# Patient Record
Sex: Female | Born: 1990 | Race: Black or African American | Hispanic: No | Marital: Single | Smoking: Former smoker
Health system: Southern US, Community
[De-identification: ages and names within clinical notes are randomized; demographics above are authoritative.]

## PROBLEM LIST (undated history)

## (undated) ENCOUNTER — Ambulatory Visit: Admission: EM | Payer: BLUE CROSS/BLUE SHIELD | Source: Home / Self Care

## (undated) HISTORY — PX: NO PAST SURGERIES: SHX2092

---
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
## 2011-06-06 ENCOUNTER — Encounter (HOSPITAL_COMMUNITY): Payer: Self-pay | Admitting: Family Medicine

## 2011-06-06 ENCOUNTER — Emergency Department (HOSPITAL_COMMUNITY): Payer: Self-pay

## 2011-06-06 ENCOUNTER — Emergency Department (HOSPITAL_COMMUNITY)
Admission: EM | Admit: 2011-06-06 | Discharge: 2011-06-06 | Disposition: A | Discharge status: Home / Self Care | Payer: Self-pay | Attending: Emergency Medicine | Admitting: Emergency Medicine

## 2011-06-06 DIAGNOSIS — M25512 Pain in left shoulder: Secondary | ICD-10-CM

## 2011-06-06 DIAGNOSIS — M25519 Pain in unspecified shoulder: Secondary | ICD-10-CM | POA: Insufficient documentation

## 2011-06-06 MED ORDER — IBUPROFEN 800 MG PO TABS: 800.0000 mg | ORAL_TABLET | Freq: Three times a day (TID) | ORAL | Status: AC

## 2011-06-06 MED ORDER — OXYCODONE-ACETAMINOPHEN 5-325 MG PO TABS
1.0000 | ORAL_TABLET | Freq: Once | ORAL | Status: AC
  Administered 2011-06-06: 1 via ORAL
  Filled 2011-06-06: qty 1

## 2011-06-06 MED ORDER — ACETAMINOPHEN-CODEINE #3 300-30 MG PO TABS: 1.0000 | ORAL_TABLET | Freq: Four times a day (QID) | ORAL | Status: AC | PRN

## 2011-06-06 MED ORDER — IBUPROFEN 800 MG PO TABS
800.0000 mg | ORAL_TABLET | Freq: Once | ORAL | Status: AC
  Administered 2011-06-06: 800 mg via ORAL
  Filled 2011-06-06: qty 1

## 2011-06-06 NOTE — Discharge Instructions (Signed)
Take ibuprofen as directed for inflammation and pain using tylenol #3 for breakthrough pain but do not drive or operate machinery with Tylenol #3 use. Establishment with a Primary Care provider is Very important for general health care concerns, minor illness and minor injury and for further evaluation and management of ongoing shoulder pain but return to ER for emergent changing or worsening of symptoms.   Shoulder Pain The shoulder is a ball and socket joint. The muscles and tendons (rotator cuff) are what keep the shoulder in its joint and stable. This collection of muscles and tendons holds in the head (ball) of the humerus (upper arm bone) in the fossa (cup) of the scapula (shoulder blade). Today no reason was found for your shoulder pain. Often pain in the shoulder may be treated conservatively with temporary immobilization. For example, holding the shoulder in one place using a sling for rest. Physical therapy may be needed if problems continue. HOME CARE INSTRUCTIONS   Apply ice to the sore area for 15 to 20 minutes, 3 to 4 times per day for the first 2 days. Put the ice in a plastic bag. Place a towel between the bag of ice and your skin.   If you have or were given a shoulder sling and straps, do not remove for as long as directed by your caregiver or until you see a caregiver for a follow-up examination. If you need to remove it to shower or bathe, move your arm as little as possible.   Sleep on several pillows at night to lessen swelling and pain.   Only take over-the-counter or prescription medicines for pain, discomfort, or fever as directed by your caregiver.   Keep any follow-up appointments in order to avoid any type of permanent shoulder disability or chronic pain problems.  SEEK MEDICAL CARE IF:   Pain in your shoulder increases or new pain develops in your arm, hand, or fingers.   Your hand or fingers are colder than your other hand.   You do not obtain pain relief with the  medications or your pain becomes worse.  SEEK IMMEDIATE MEDICAL CARE IF:   Your arm, hand, or fingers are numb or tingling.   Your arm, hand, or fingers are swollen, painful, or turn white or blue.   You develop chest pain or shortness of breath.  MAKE SURE YOU:   Understand these instructions.   Will watch your condition.   Will get help right away if you are not doing well or get worse.  Document Released: 10/28/2004 Document Revised: 01/07/2011 Document Reviewed: 01/02/2011 [facility] Patient Information 2012 [place], [place].

## 2011-06-06 NOTE — ED Provider Notes (Signed)
History     CSN: [id number]  Arrival date & time 06/06/11  4098   First MD Initiated Contact with Patient 06/06/11 0830      Chief Complaint  Patient presents with  . Shoulder Pain    (Consider location/radiation/quality/duration/timing/severity/associated sxs/prior treatment) HPI  Patient presents to ER complaining of acute onset left shoulder pain that woke her from her sleep. Patient denies known injury to shoulder. Patient states pain is constant and unchanged by movement. She denies redness, swelling, or LUE numbness/tingling/weakness. She denies CP, SOB, abdominal pain, n/v. She took nothing for pain PTA. Patient states pain radiated down from shoulder into arm.   History reviewed. No pertinent past medical history.  History reviewed. No pertinent past surgical history.  History reviewed. No pertinent family history.  History  Substance Use Topics  . Smoking status: Current Some Day Smoker  . Smokeless tobacco: Not on file  . Alcohol Use: No    OB History    Grav Para Term Preterm Abortions TAB SAB Ect Mult Living                  Review of Systems  All other systems reviewed and are negative.    Allergies  Review of patient's allergies indicates no known allergies.  Home Medications  No current outpatient prescriptions on file.  BP 125/67  Pulse 73  Temp 97.5 F (36.4 C)  Resp 18  SpO2 100%  LMP 05/27/2011  Physical Exam  Nursing note and vitals reviewed. Constitutional: She is oriented to person, place, and time. She appears well-developed and well-nourished. No distress.  HENT:  Head: Normocephalic and atraumatic.  Eyes: Conjunctivae are normal.  Neck: Normal range of motion. Neck supple.  Cardiovascular: Normal rate, regular rhythm, normal heart sounds and intact distal pulses.  Exam reveals no gallop and no friction rub.   No murmur heard. Pulmonary/Chest: Effort normal and breath sounds normal. No respiratory distress. She has no wheezes.  She has no rales. She exhibits no tenderness.  Abdominal: Soft. Bowel sounds are normal. She exhibits no distension and no mass. There is no tenderness. There is no rebound and no guarding.  Musculoskeletal: Normal range of motion. She exhibits no edema and no tenderness.       No TTP of entire left shoulder though complaints of constant pain despite touch or movement. 5/5 strength of LUE with FROM and no crepitous. No swelling, erythema or heat of shoulder. Good radial pulse and sensation of entire LUE.   Neurological: She is alert and oriented to person, place, and time. She has normal reflexes.  Skin: Skin is warm and dry. No rash noted. She is not diaphoretic. No erythema.  Psychiatric: She has a normal mood and affect.    ED Course  Procedures (including critical care time)  PO ibuprofen and percocet.   Labs Reviewed - No data to display Dg Shoulder Left  06/06/2011  *RADIOLOGY REPORT*  Clinical Data: Left shoulder pain.  Limited range of motion.  No known injury.  LEFT SHOULDER - 2+ VIEW 06/06/2011:  Comparison: None.  Findings: No evidence of acute fracture or dislocation. Subacromial space well preserved.  Acromioclavicular joint intact without significant degenerative change.  No intrinsic osseous abnormalities.  IMPRESSION: Normal examination.  Original Report Authenticated By: Arnell Sieving, M.D.     1. Left shoulder pain       MDM  FROM of left shoulder with 5/5 strength, normal reflexes and no signs or symptoms of septic joint  with normal shoulder xray. LUE is neurovas intact Patient is pushing herself up and down in bed with left shoulder without difficulty stating improved pain after PO pain medication. Denies CP, SOB or abdominal pain limiting pain to shoulder. Spoke at length about conservative management of pain and reasons to return to ER vs f/u with PCP for recheck of ongoing pain.         Lenon Oms [place], [place] 06/06/11 [phone]

## 2011-06-06 NOTE — ED Notes (Signed)
sts she woke up to pain in shoulder this am. Limited mobility in left shoulder. Denies injury.

## 2011-06-07 NOTE — ED Provider Notes (Signed)
Medical screening examination/treatment/procedure(s) were performed by non-physician practitioner and as supervising physician I was immediately available for consultation/collaboration.  Theodoro Koval, MD 06/07/11 0403 

## 2012-05-25 ENCOUNTER — Encounter (HOSPITAL_COMMUNITY): Payer: Self-pay | Admitting: Emergency Medicine

## 2012-05-25 ENCOUNTER — Emergency Department (HOSPITAL_COMMUNITY)
Admission: EM | Admit: 2012-05-25 | Discharge: 2012-05-25 | Disposition: A | Discharge status: Home / Self Care | Payer: No Typology Code available for payment source | Attending: Emergency Medicine | Admitting: Emergency Medicine

## 2012-05-25 ENCOUNTER — Emergency Department (HOSPITAL_COMMUNITY): Payer: No Typology Code available for payment source

## 2012-05-25 DIAGNOSIS — F17200 Nicotine dependence, unspecified, uncomplicated: Secondary | ICD-10-CM | POA: Insufficient documentation

## 2012-05-25 DIAGNOSIS — Y9389 Activity, other specified: Secondary | ICD-10-CM | POA: Insufficient documentation

## 2012-05-25 DIAGNOSIS — S0993XA Unspecified injury of face, initial encounter: Secondary | ICD-10-CM | POA: Insufficient documentation

## 2012-05-25 DIAGNOSIS — M542 Cervicalgia: Secondary | ICD-10-CM

## 2012-05-25 DIAGNOSIS — Y92410 Unspecified street and highway as the place of occurrence of the external cause: Secondary | ICD-10-CM | POA: Insufficient documentation
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
## 2012-05-25 NOTE — ED Notes (Signed)
Patient transported to X-ray 

## 2012-06-27 ENCOUNTER — Encounter (HOSPITAL_COMMUNITY): Payer: Self-pay | Admitting: Emergency Medicine

## 2012-06-27 ENCOUNTER — Emergency Department (HOSPITAL_COMMUNITY)
Admission: EM | Admit: 2012-06-27 | Discharge: 2012-06-28 | Disposition: A | Discharge status: Home / Self Care | Payer: Self-pay | Attending: Emergency Medicine | Admitting: Emergency Medicine

## 2012-06-27 DIAGNOSIS — N920 Excessive and frequent menstruation with regular cycle: Secondary | ICD-10-CM | POA: Insufficient documentation

## 2012-06-27 DIAGNOSIS — Z3202 Encounter for pregnancy test, result negative: Secondary | ICD-10-CM | POA: Insufficient documentation

## 2012-06-27 DIAGNOSIS — R509 Fever, unspecified: Secondary | ICD-10-CM | POA: Insufficient documentation

## 2012-06-27 DIAGNOSIS — F17200 Nicotine dependence, unspecified, uncomplicated: Secondary | ICD-10-CM | POA: Insufficient documentation

## 2012-06-27 DIAGNOSIS — R109 Unspecified abdominal pain: Secondary | ICD-10-CM | POA: Insufficient documentation

## 2012-06-27 DIAGNOSIS — R350 Frequency of micturition: Secondary | ICD-10-CM | POA: Insufficient documentation

## 2012-06-27 DIAGNOSIS — R300 Dysuria: Secondary | ICD-10-CM | POA: Insufficient documentation

## 2012-06-27 MED ORDER — FENTANYL CITRATE 0.05 MG/ML IJ SOLN
50.0000 ug | Freq: Once | INTRAMUSCULAR | Status: AC
  Administered 2012-06-27: 50 ug via INTRAVENOUS
  Filled 2012-06-27: qty 2

## 2012-06-27 MED ORDER — ONDANSETRON HCL 4 MG/2ML IJ SOLN
4.0000 mg | Freq: Once | INTRAMUSCULAR | Status: AC
  Administered 2012-06-27: 4 mg via INTRAVENOUS
  Filled 2012-06-27: qty 2

## 2012-06-27 MED ORDER — SODIUM CHLORIDE 0.9 % IV SOLN
Freq: Once | INTRAVENOUS | Status: AC
  Administered 2012-06-27: 23:00:00 via INTRAVENOUS

## 2012-06-27 MED ORDER — DEXTROSE 5 % IV SOLN
1.0000 g | Freq: Once | INTRAVENOUS | Status: AC
  Administered 2012-06-28: 1 g via INTRAVENOUS
  Filled 2012-06-27: qty 10

## 2012-06-27 MED ORDER — KETOROLAC TROMETHAMINE 30 MG/ML IJ SOLN
30.0000 mg | Freq: Once | INTRAMUSCULAR | Status: AC
  Administered 2012-06-27: 30 mg via INTRAVENOUS
  Filled 2012-06-27: qty 1

## 2012-06-27 MED ORDER — ACETAMINOPHEN 325 MG PO TABS
650.0000 mg | ORAL_TABLET | Freq: Once | ORAL | Status: AC
  Administered 2012-06-27: 650 mg via ORAL
  Filled 2012-06-27: qty 2
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
## 2012-06-28 LAB — COMPREHENSIVE METABOLIC PANEL
CO2: 24 mEq/L (ref 19–32)
Calcium: 9.4 mg/dL (ref 8.4–10.5)
Creatinine, Ser: 0.83 mg/dL (ref 0.50–1.10)
GFR calc Af Amer: >90 mL/min (ref >90)
GFR calc non Af Amer: >90 mL/min (ref >90)
Glucose, Bld: 86 mg/dL (ref 70–99)

## 2012-06-28 LAB — URINALYSIS, ROUTINE W REFLEX MICROSCOPIC
Protein, ur: NEGATIVE mg/dL
Urobilinogen, UA: 1 mg/dL (ref 0.0–1.0)

## 2012-06-28 LAB — PREGNANCY, URINE: Preg Test, Ur: NEGATIVE

## 2012-06-28 LAB — CBC WITH DIFFERENTIAL/PLATELET
Eosinophils Relative: 2 % (ref 0–5)
HCT: 36.7 % (ref 36.0–46.0)
Lymphocytes Relative: 21 % (ref 12–46)
Lymphs Abs: 1.6 10*3/uL (ref 0.7–4.0)
MCV: 84.2 fL (ref 78.0–100.0)
Monocytes Absolute: 0.4 10*3/uL (ref 0.1–1.0)
RDW: 13.6 % (ref 11.5–15.5)
WBC: 7.8 10*3/uL (ref 4.0–10.5)

## 2012-06-28 MED ORDER — SODIUM CHLORIDE 0.9 % IV SOLN
Freq: Once | INTRAVENOUS | Status: AC
  Administered 2012-06-28: via INTRAVENOUS

## 2012-06-28 MED ORDER — NAPROXEN 500 MG PO TABS: 500.0000 mg | ORAL_TABLET | Freq: Two times a day (BID) | ORAL | Status: DC

## 2012-06-28 NOTE — ED Provider Notes (Signed)
Medical screening examination/treatment/procedure(s) were performed by non-physician practitioner and as supervising physician I was immediately available for consultation/collaboration.  Lissett Favorite, MD 06/28/12 1812 

## 2012-06-28 NOTE — ED Provider Notes (Signed)
History     CSN: [id number]  Arrival date & time 06/27/12  2235   First MD Initiated Contact with Patient 06/27/12 2243      Chief Complaint  Patient presents with  . Flank Pain    (Consider location/radiation/quality/duration/timing/severity/associated sxs/prior treatment) HPI Briana Warren is a 22 y.o. female with no significant past medical history presents emergency department complaining of right-sided flank pain that has been worsening for the last 4 days.  Pain is described as cramping, rated at 9/10 in severity.  Symptoms are moderate.  No known exacerbating or alleviating factors.  Associated symptoms include fever, dysuria, urinary frequency, and menorrhagia.  Patient denies any dyspareunia, abnormal vaginal discharge, malodor, vomiting, diarrhea, melena, anorexia.  History reviewed. No pertinent past medical history.  History reviewed. No pertinent past surgical history.  History reviewed. No pertinent family history.  History  Substance Use Topics  . Smoking status: Current Every Day Smoker  . Smokeless tobacco: Not on file  . Alcohol Use: No    OB History   Grav Para Term Preterm Abortions TAB SAB Ect Mult Living                  Review of Systems Ten systems reviewed and are negative for acute change, except as noted in the HPI.   Allergies  Review of patient's allergies indicates no known allergies.  Home Medications  No current outpatient prescriptions on file.  BP 98/54  Pulse 78  Temp(Src) 98.8 F (37.1 C) (Oral)  Resp 18  SpO2 99%  Physical Exam  Nursing note and vitals reviewed. Constitutional: She is oriented to person, place, and time. She appears well-developed and well-nourished.  HENT:  Head: Normocephalic and atraumatic.  Eyes: Conjunctivae and EOM are normal.  Neck: Normal range of motion.  Cardiovascular:  Regular rate rhythm  Pulmonary/Chest: Effort normal.  Lungs clear to auscultation bilaterally.  No chest wall tenderness   Abdominal:  Abdomen soft nontender  Genitourinary:  No CVA tenderness  Musculoskeletal: Normal range of motion.  Neurological: She is alert and oriented to person, place, and time.  Skin: Skin is warm and dry. No rash noted. She is not diaphoretic.  Psychiatric: She has a normal mood and affect. Her behavior is normal.    ED Course  Procedures (including critical care time)  Labs Reviewed  URINALYSIS, ROUTINE W REFLEX MICROSCOPIC - Abnormal; Notable for the following:    Hgb urine dipstick MODERATE (*)    Leukocytes, UA SMALL (*)    All other components within normal limits  CBC WITH DIFFERENTIAL  COMPREHENSIVE METABOLIC PANEL  LIPASE, BLOOD  PREGNANCY, URINE  URINE MICROSCOPIC-ADD ON   No results found.   No diagnosis found.  BP 98/54  Pulse 78  Temp(Src) 98.8 F (37.1 C) (Oral)  Resp 18  SpO2 99%   MDM  Patient emergency department febrile complaining of right flank pain and urinary symptoms.  IV Rocephin started for concern for pyelonephritis, however urinalysis is not consistent with urinary tract infection.  No CVA or suprapubic abdominal tenderness on exam.  No peritoneal signs. Patient also given IV fluids and Toradol.  Significant improvement and pain free after treatment in the emergency department.  Patient to be discharged with primary care followup and strict return precautions.        Jaci Carrel, [place] 06/28/12 0126

## 2015-02-25 IMAGING — CR DG CERVICAL SPINE COMPLETE 4+V
8 series · 8 of 8 positions shown · non-contrast
Comparison: None.

CLINICAL DATA: Pain post trauma

CERVICAL SPINE - COMPLETE 4+ VIEW

[w cervical spine lat]
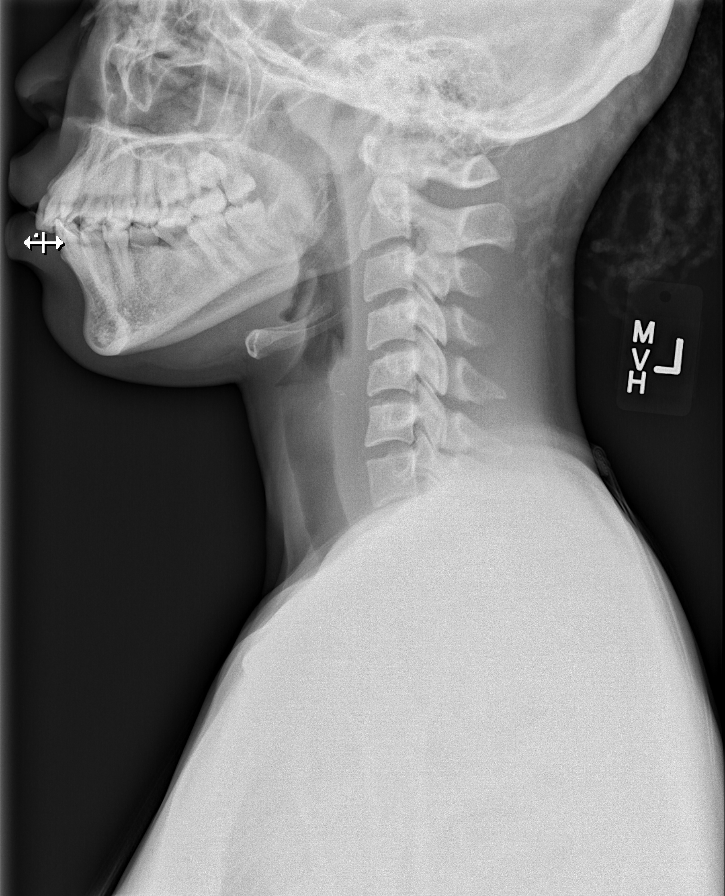

[w cervical spine ap_obl (1 of 2)]
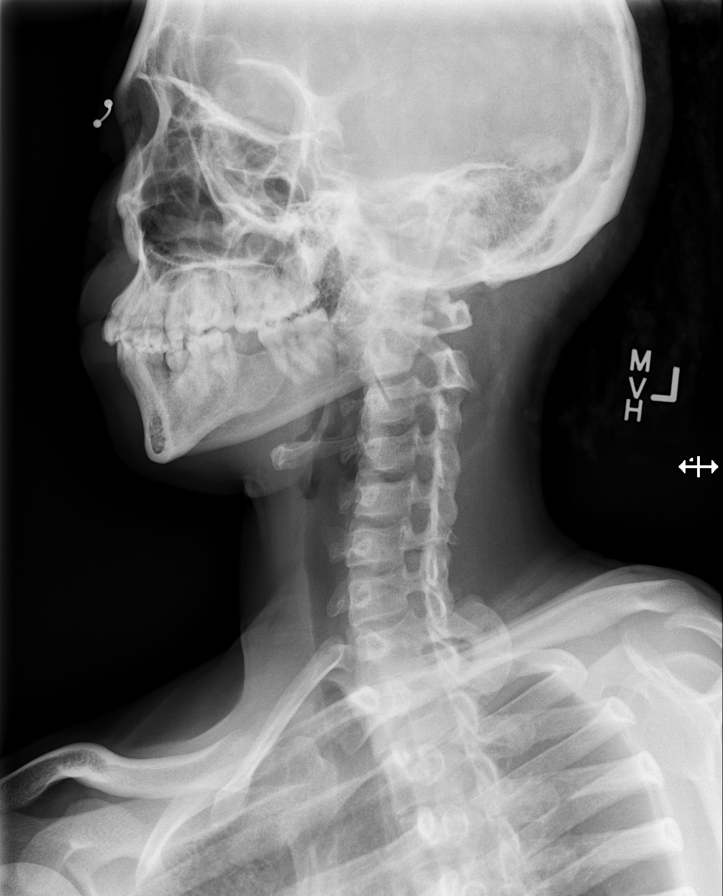

[w cervical spine ap_obl (2 of 2)]
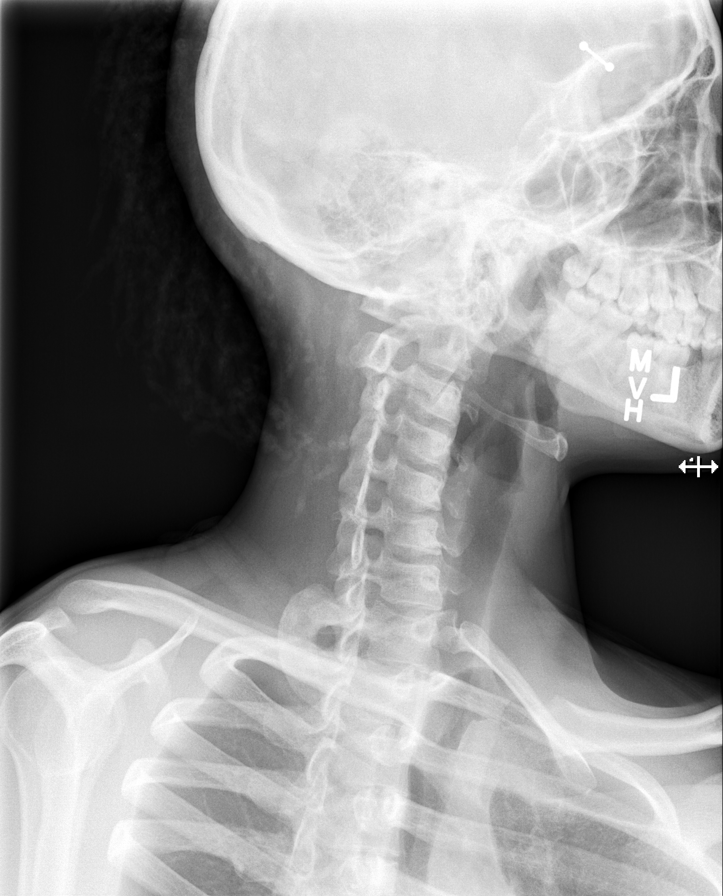

[w cervical spine ap]
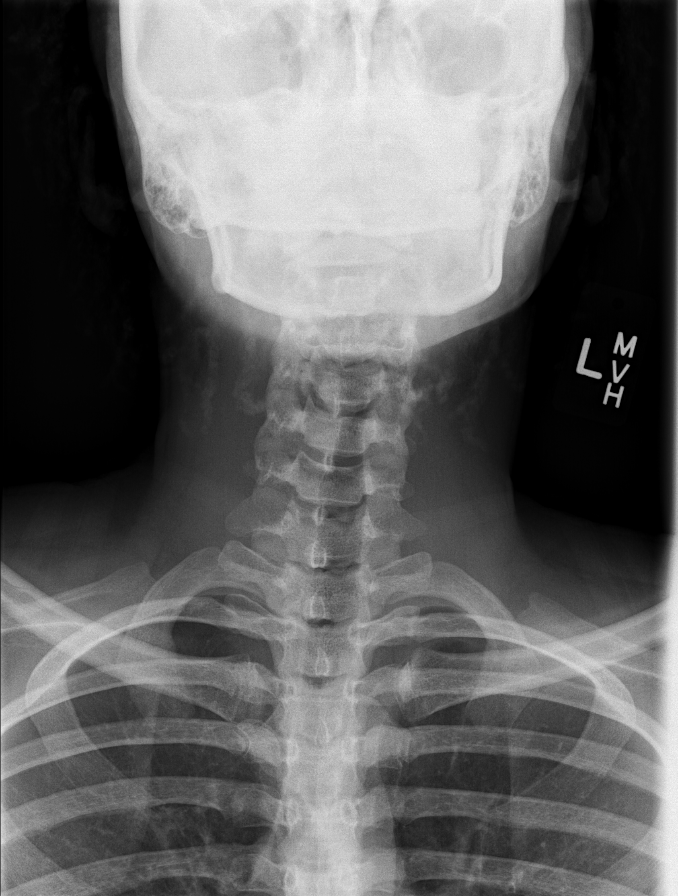

[w cervical spine odontoid (1 of 3)]
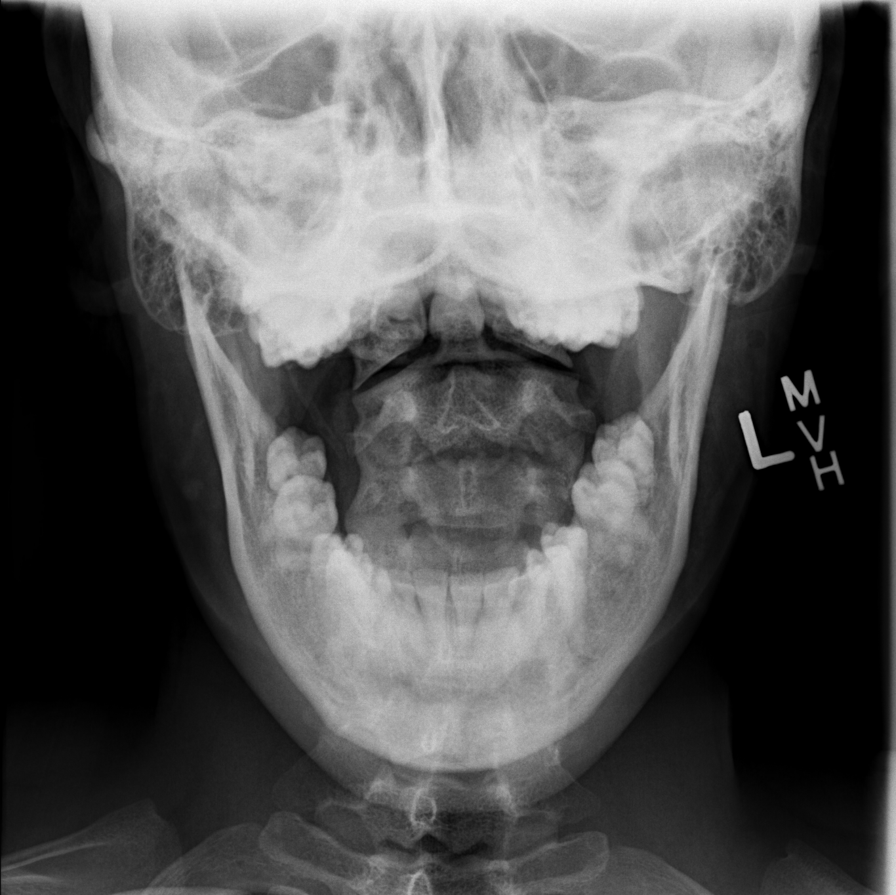

[w cervical spine odontoid (2 of 3)]
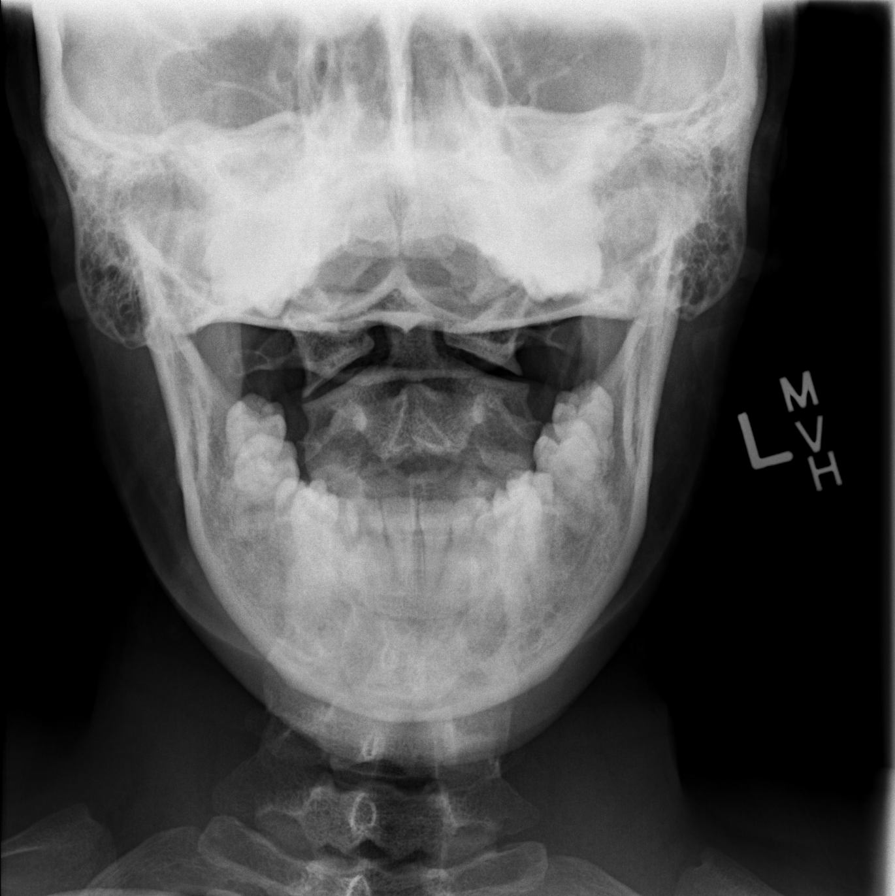

[w cervical spine odontoid (3 of 3)]
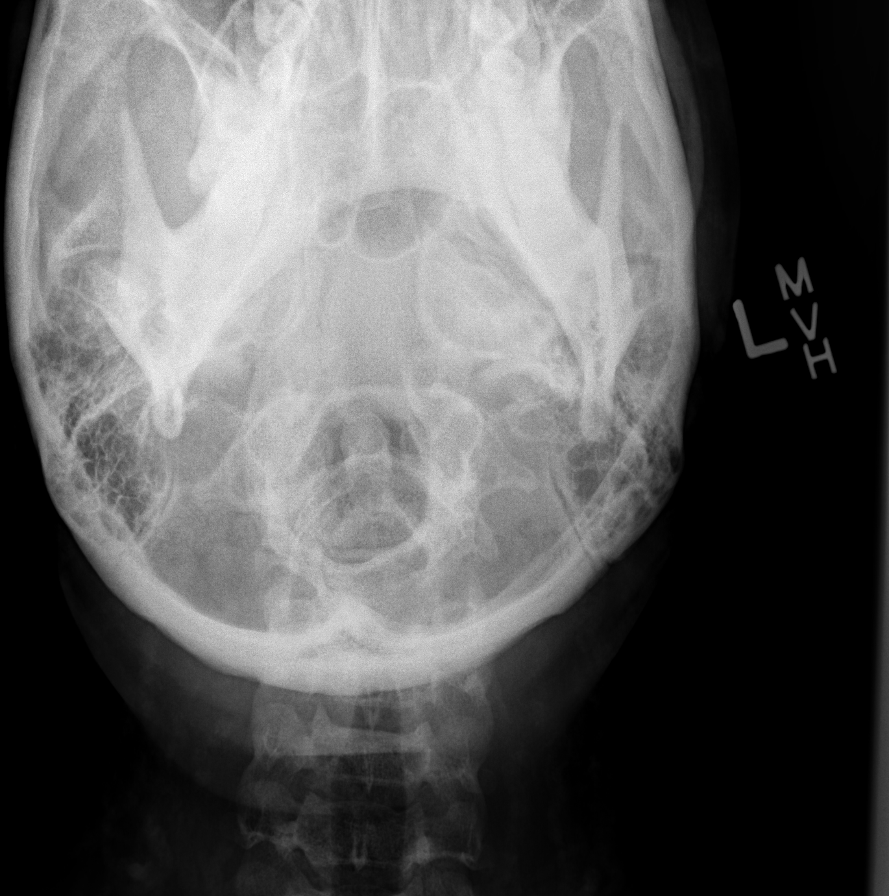

[w cervical swimmers]
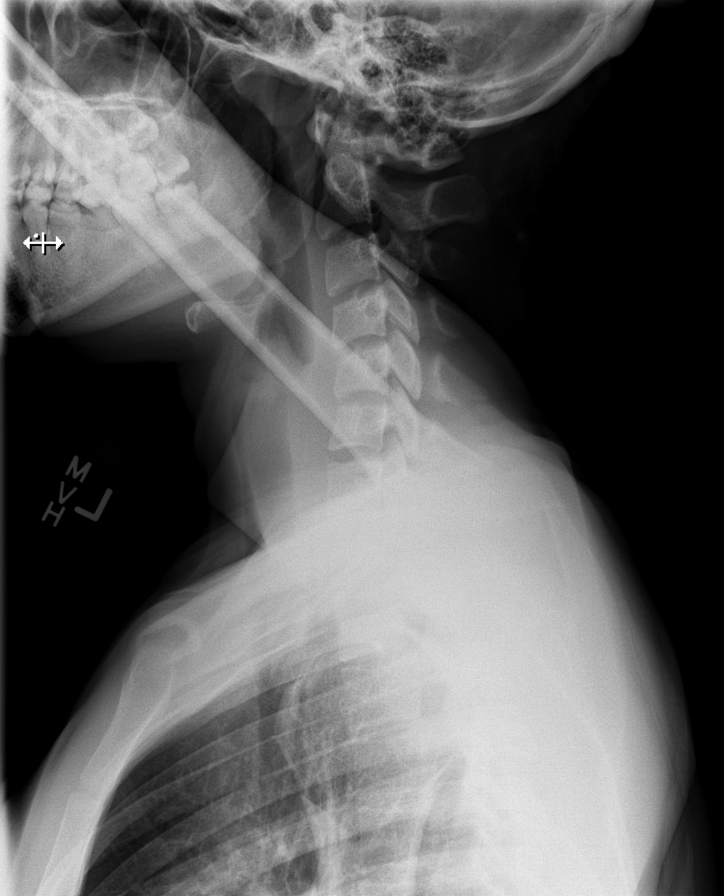

[8 of 8 positions shown; findings below may reference images not displayed]

FINDINGS: Frontal, lateral, bilateral oblique, and open mouth
odontoid images were obtained.  There is no fracture or
spondylolisthesis.  Prevertebral soft tissues and predental space
regions are normal.  Disc spaces appear intact.  There is no
appreciable exit foraminal narrowing on the oblique views.

There is reversal of the lordotic curvature.
IMPRESSION: Reversal lordotic curvature.  Suspect muscle spasm.  If
there is concern for ligamentous injury, lateral flexion and
extension views could be helpful to further assess.

There is no fracture or spondylolisthesis.

## 2019-08-02 ENCOUNTER — Emergency Department
Admission: EM | Admit: 2019-08-02 | Discharge: 2019-08-02 | Disposition: A | Discharge status: Home / Self Care | Payer: Self-pay | Attending: Emergency Medicine | Admitting: Emergency Medicine

## 2019-08-02 ENCOUNTER — Other Ambulatory Visit: Payer: Self-pay

## 2019-08-02 ENCOUNTER — Encounter: Payer: Self-pay | Admitting: Emergency Medicine

## 2019-08-02 DIAGNOSIS — Y92410 Unspecified street and highway as the place of occurrence of the external cause: Secondary | ICD-10-CM | POA: Insufficient documentation

## 2019-08-02 DIAGNOSIS — S40012A Contusion of left shoulder, initial encounter: Secondary | ICD-10-CM | POA: Insufficient documentation

## 2019-08-02 DIAGNOSIS — Y999 Unspecified external cause status: Secondary | ICD-10-CM | POA: Insufficient documentation

## 2019-08-02 DIAGNOSIS — S161XXA Strain of muscle, fascia and tendon at neck level, initial encounter: Secondary | ICD-10-CM | POA: Insufficient documentation

## 2019-08-02 DIAGNOSIS — Y939 Activity, unspecified: Secondary | ICD-10-CM | POA: Insufficient documentation

## 2019-08-02 DIAGNOSIS — F17210 Nicotine dependence, cigarettes, uncomplicated: Secondary | ICD-10-CM | POA: Insufficient documentation

## 2019-08-02 MED ORDER — IBUPROFEN 600 MG PO TABS: 600.0000 mg | ORAL_TABLET | Freq: Four times a day (QID) | ORAL | 0 refills | Status: DC | PRN

## 2019-08-02 MED ORDER — CYCLOBENZAPRINE HCL 10 MG PO TABS
10.0000 mg | ORAL_TABLET | Freq: Once | ORAL | Status: AC
  Administered 2019-08-02: 10 mg via ORAL
  Filled 2019-08-02: qty 1

## 2019-08-02 MED ORDER — IBUPROFEN 800 MG PO TABS
800.0000 mg | ORAL_TABLET | Freq: Once | ORAL | Status: AC
  Administered 2019-08-02: 800 mg via ORAL
  Filled 2019-08-02: qty 1

## 2019-08-02 MED ORDER — CYCLOBENZAPRINE HCL 5 MG PO TABS: 5.0000 mg | ORAL_TABLET | Freq: Three times a day (TID) | ORAL | 0 refills | Status: DC | PRN

## 2019-08-02 NOTE — ED Notes (Signed)
Pt states having pain to her left shoulder that radiates to her neck. Patient denies loc.
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
## 2019-08-02 NOTE — Discharge Instructions (Signed)
Please apply ice to the left shoulder and cervical spine 20 minutes every hour for the next 2 days.  After 2 days transition to heat.  You may take Tylenol and ibuprofen as needed from pain.  Take Flexeril as needed for muscle tightness and spasms.  Try to move and stay active and perform gentle stretching exercises.  Return to the ER for any worsening symptoms or urgent changes in health.

## 2019-08-13 ENCOUNTER — Other Ambulatory Visit: Payer: Self-pay

## 2019-08-13 ENCOUNTER — Ambulatory Visit
Admission: EM | Admit: 2019-08-13 | Discharge: 2019-08-13 | Disposition: A | Discharge status: Home / Self Care | Payer: Self-pay | Attending: Family Medicine | Admitting: Family Medicine

## 2019-08-13 ENCOUNTER — Ambulatory Visit (INDEPENDENT_AMBULATORY_CARE_PROVIDER_SITE_OTHER): Payer: Self-pay

## 2019-08-13 ENCOUNTER — Encounter: Payer: Self-pay | Admitting: Emergency Medicine

## 2019-08-13 DIAGNOSIS — M25512 Pain in left shoulder: Secondary | ICD-10-CM

## 2019-08-13 DIAGNOSIS — S29019A Strain of muscle and tendon of unspecified wall of thorax, initial encounter: Secondary | ICD-10-CM

## 2019-08-13 MED ORDER — TIZANIDINE HCL 4 MG PO TABS: 4.0000 mg | ORAL_TABLET | Freq: Three times a day (TID) | ORAL | 0 refills | Status: DC | PRN

## 2019-08-13 MED ORDER — MELOXICAM 15 MG PO TABS: 15.0000 mg | ORAL_TABLET | Freq: Every day | ORAL | 0 refills | Status: DC
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
## 2019-08-13 NOTE — Discharge Instructions (Signed)
Heat, ice, over the counter tylenol
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
## 2020-02-07 ENCOUNTER — Other Ambulatory Visit: Payer: Self-pay

## 2020-02-07 DIAGNOSIS — Z20822 Contact with and (suspected) exposure to covid-19: Secondary | ICD-10-CM

## 2020-02-12 LAB — NOVEL CORONAVIRUS, NAA: SARS-CoV-2, NAA: DETECTED — AB

## 2022-10-14 ENCOUNTER — Ambulatory Visit
Admission: EM | Admit: 2022-10-14 | Discharge: 2022-10-14 | Disposition: A | Discharge status: Home / Self Care | Payer: BLUE CROSS/BLUE SHIELD | Attending: Family Medicine | Admitting: Family Medicine

## 2022-10-14 ENCOUNTER — Ambulatory Visit (INDEPENDENT_AMBULATORY_CARE_PROVIDER_SITE_OTHER): Payer: BLUE CROSS/BLUE SHIELD

## 2022-10-14 DIAGNOSIS — M7581 Other shoulder lesions, right shoulder: Secondary | ICD-10-CM

## 2022-10-14 DIAGNOSIS — M25511 Pain in right shoulder: Secondary | ICD-10-CM

## 2022-10-14 MED ORDER — IBUPROFEN 800 MG PO TABS
800.0000 mg | ORAL_TABLET | Freq: Once | ORAL | Status: AC
  Administered 2022-10-14: 800 mg via ORAL

## 2022-10-14 MED ORDER — NAPROXEN 500 MG PO TABS: 500.0000 mg | ORAL_TABLET | Freq: Two times a day (BID) | ORAL | 0 refills | Status: AC

## 2022-10-14 MED ORDER — NAPROXEN 500 MG PO TABS: 500.0000 mg | ORAL_TABLET | Freq: Two times a day (BID) | ORAL | 0 refills | Status: DC

## 2022-10-14 MED ORDER — CYCLOBENZAPRINE HCL 5 MG PO TABS: 5.0000 mg | ORAL_TABLET | Freq: Three times a day (TID) | ORAL | 0 refills | Status: DC | PRN

## 2022-10-14 MED ORDER — CYCLOBENZAPRINE HCL 5 MG PO TABS: 5.0000 mg | ORAL_TABLET | Freq: Three times a day (TID) | ORAL | 0 refills | Status: AC | PRN

## 2022-10-14 NOTE — ED Provider Notes (Signed)
[facility]    CSN: [id number] Arrival date & time: 10/14/22  1023      History   Chief Complaint Chief Complaint  Patient presents with   Shoulder Pain    HPI  HPI Anavel Hollier is a 32 y.o. female.   Tanishia presents for right shoulder pain. Five days ago she ewas working out. Denies heavy lifting, only lifted 15 lb and hasn't been to the gym in months.  A couple nights ago, she noticed it hurt when she slept on that side. She woke up this morning and it was extremely painful.  No treamtnets prior to arrival.  No ice or heat. No preivous injury. No trauma or falls.      History reviewed. No pertinent past medical history.  There are no problems to display for this patient.   Past Surgical History:  Procedure Laterality Date   NO PAST SURGERIES      OB History   No obstetric history on file.      Home Medications    Prior to Admission medications   Medication Sig Start Date End Date Taking? Authorizing Provider  cyclobenzaprine (FLEXERIL) 5 MG tablet Take 1-2 tablets (5-10 mg total) by mouth 3 (three) times daily as needed for muscle spasms. 10/14/22   Hallee Mckenny, Seward Meth, DO  naproxen (NAPROSYN) 500 MG tablet Take 1 tablet (500 mg total) by mouth 2 (two) times daily. 10/14/22   Katha Cabal, DO    Family History Family History  Problem Relation Age of Onset   Osteoarthritis Mother    Healthy Father     Social History Social History   Tobacco Use   Smoking status: Former    Types: Cigars   Smokeless tobacco: Never   Tobacco comments:    1 black and mild daily  Vaping Use   Vaping status: Never Used  Substance Use Topics   Alcohol use: Not Currently    Comment: social   Drug use: No     Allergies   Patient has no known allergies.   Review of Systems Review of Systems: :negative unless otherwise stated in HPI.      Physical Exam Triage Vital Signs ED Triage Vitals  Encounter Vitals Group     BP      Systolic BP Percentile       Diastolic BP Percentile      Pulse      Resp      Temp      Temp src      SpO2      Weight      Height      Head Circumference      Peak Flow      Pain Score      Pain Loc      Pain Education      Exclude from Growth Chart    No data found.  Updated Vital Signs BP 110/60 (BP Location: Left Arm)   Pulse 84   Temp 98.4 F (36.9 C) (Oral)   Ht 5\' 6"  (1.676 m)   Wt 77.1 kg   LMP 09/13/2022   SpO2 100%   BMI 27.44 kg/m   Visual Acuity Right Eye Distance:   Left Eye Distance:   Bilateral Distance:    Right Eye Near:   Left Eye Near:    Bilateral Near:     Physical Exam GEN: well appearing female in no acute distress  CVS: well perfused  RESP: speaking in full sentences  without pause, no respiratory distress  MSK: right shoulder:  No evidence of bony deformity, asymmetry, or muscle atrophy. No tenderness over long head of biceps (bicipital groove).  No TTP at [facility] joint.  Decreased active and passive (ABD, ADD, Flexion, extension, IR, ER). Limited  2/2 to pain.  Strength 5/5 grip, elbow and 4-/5 shoulder. No abnormal scapular function observed.  Special Tests: Hawkins: Attempted; Empty Can: Positive, Neer's: Negative; Painful arc: Positive; Anterior Apprehension: Negative Sensation intact. Peripheral pulses intact.   UC Treatments / Results  Labs (all labs ordered are listed, but only abnormal results are displayed) Labs Reviewed - No data to display  EKG   Radiology DG Shoulder Right  Result Date: 10/14/2022 CLINICAL DATA:  Right shoulder pain for 3 days EXAM: RIGHT SHOULDER - 2+ VIEW COMPARISON:  None available FINDINGS: No fracture, dislocation, or soft tissue abnormality. IMPRESSION: Normal radiographic appearance of right shoulder. Electronically Signed   By: Acquanetta Belling M.D.   On: 10/14/2022 12:43     Procedures Procedures (including critical care time)  Medications Ordered in UC Medications  ibuprofen (ADVIL) tablet 800 mg (800 mg Oral Given  10/14/22 1143)    Initial Impression / Assessment and Plan / UC Course  I have reviewed the triage vital signs and the nursing notes.  Pertinent labs & imaging results that were available during my care of the patient were reviewed by me and considered in my medical decision making (see chart for details).      Pt is a 32 y.o.  female with 3 days of right shoulder pain after lifting weights at the gym.  Given 800 mg ibuprofen for pain.  On exam, pt has tenderness at the glenohumeral joint concerning for possible dislocation versus fracture versus tendinopathy.   Obtained right shoulder plain films.  Personally interpreted by me were unremarkable for fracture or dislocation. Radiologist report reviewed.  Placed in shoulder sling for comfort.  Largely suspect tendinitis.  Patient to gradually return to normal activities, as tolerated and continue ordinary activities within the limits permitted by pain. Prescribed Naproxen sodium  and muscle relaxer  for pain relief.  Tylenol PRN. Advised patient to avoid OTC NSAIDs while taking prescription NSAID. Counseled patient on red flag symptoms and when to seek immediate care.   Patient to follow up with orthopedic provider..  Return and ED precautions given. Understanding voiced. Discussed MDM, treatment plan and plan for follow-up with patient who agrees with plan.   Final Clinical Impressions(s) / UC Diagnoses   Final diagnoses:  Tendinitis of right rotator cuff     Discharge Instructions      On my review of your xray images, you did not have any fractures or dislocated bone. No evidence of bony irregularity to indicate torn rotator cuff  The radiologist has not yet read your xray. If it is significantly abnormal or urgent, someone will contact you.  You should see your results in MyChart.   Take the Flexeril 3 times a day as needed for muscle spasms related to your shoulder injury.  Take the Naprosyn with Tylenol twice a day for pain. Warm  compresses can promote healing.  Follow-up with Dr. Joseph Berkshire who is upstairs in this building or an [facility] location in  [place] or [place].   Recommend not lifting anything greater than 10 pounds over the next 1 to 2 weeks.        ED Prescriptions     Medication Sig Dispense Auth. Provider   cyclobenzaprine (  FLEXERIL) 5 MG tablet Take 1-2 tablets (5-10 mg total) by mouth 3 (three) times daily as needed for muscle spasms. 30 tablet Madia Carvell, DO   naproxen (NAPROSYN) 500 MG tablet Take 1 tablet (500 mg total) by mouth 2 (two) times daily. 30 tablet Katha Cabal, DO      PDMP not reviewed this encounter.   Katha Cabal, DO 10/17/22 0041

## 2022-10-14 NOTE — ED Triage Notes (Signed)
Pt c/o right shoulder pain x2-3days  Pt states that she has been working out and started to have slight shoulder pain 3 days ago. Pt states that her arm is extremely stiff today and she can not move her arm without pain.   Pt is worried about a torn shoulder muscle  Pt denies lifting heavy and states she was doing light back and shoulder workouts.

## 2022-10-14 NOTE — Discharge Instructions (Addendum)
On my review of your xray images, you did not have any fractures or dislocated bone. No evidence of bony irregularity to indicate torn rotator cuff  The radiologist has not yet read your xray. If it is significantly abnormal or urgent, someone will contact you.  You should see your results in MyChart.   Take the Flexeril 3 times a day as needed for muscle spasms related to your shoulder injury.  Take the Naprosyn with Tylenol twice a day for pain. Warm compresses can promote healing.  Follow-up with Dr. Joseph Berkshire who is upstairs in this building or an [facility] location in  [place] or [place].   Recommend not lifting anything greater than 10 pounds over the next 1 to 2 weeks.
